# Patient Record
Sex: Female | Born: 2000 | Race: White | Hispanic: No | Marital: Single | Smoking: Current some day smoker
Health system: Southern US, Community
[De-identification: ages and names within clinical notes are randomized; demographics above are authoritative.]

## PROBLEM LIST (undated history)

## (undated) DIAGNOSIS — J45909 Unspecified asthma, uncomplicated: Secondary | ICD-10-CM

## (undated) DIAGNOSIS — F32A Depression, unspecified: Secondary | ICD-10-CM

## (undated) DIAGNOSIS — F909 Attention-deficit hyperactivity disorder, unspecified type: Secondary | ICD-10-CM

## (undated) DIAGNOSIS — F419 Anxiety disorder, unspecified: Secondary | ICD-10-CM

---
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
## 2020-03-11 ENCOUNTER — Other Ambulatory Visit: Payer: Self-pay

## 2020-03-11 ENCOUNTER — Encounter: Payer: Self-pay | Admitting: Emergency Medicine

## 2020-03-11 DIAGNOSIS — F17210 Nicotine dependence, cigarettes, uncomplicated: Secondary | ICD-10-CM | POA: Diagnosis not present

## 2020-03-11 DIAGNOSIS — F320 Major depressive disorder, single episode, mild: Secondary | ICD-10-CM | POA: Insufficient documentation

## 2020-03-11 DIAGNOSIS — J45909 Unspecified asthma, uncomplicated: Secondary | ICD-10-CM | POA: Insufficient documentation

## 2020-03-11 DIAGNOSIS — R45851 Suicidal ideations: Secondary | ICD-10-CM | POA: Insufficient documentation
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
## 2020-03-12 ENCOUNTER — Emergency Department
Admission: EM | Admit: 2020-03-12 | Discharge: 2020-03-12 | Disposition: A | Discharge status: Home / Self Care | Payer: Medicaid Other | Attending: Emergency Medicine | Admitting: Emergency Medicine

## 2020-03-12 DIAGNOSIS — R45851 Suicidal ideations: Secondary | ICD-10-CM

## 2020-03-12 HISTORY — DX: Anxiety disorder, unspecified: F41.9

## 2020-03-12 HISTORY — DX: Attention-deficit hyperactivity disorder, unspecified type: F90.9

## 2020-03-12 HISTORY — DX: Depression, unspecified: F32.A

## 2020-03-12 HISTORY — DX: Unspecified asthma, uncomplicated: J45.909

## 2020-03-12 LAB — CBC
HCT: 46.9 % — ABNORMAL HIGH (ref 36.0–46.0)
Hemoglobin: 15.9 g/dL — ABNORMAL HIGH (ref 12.0–15.0)
MCH: 30.5 pg (ref 26.0–34.0)
MCHC: 33.9 g/dL (ref 30.0–36.0)
MCV: 90 fL (ref 80.0–100.0)
Platelets: 329 10*3/uL (ref 150–400)
RBC: 5.21 MIL/uL — ABNORMAL HIGH (ref 3.87–5.11)
RDW: 13.2 % (ref 11.5–15.5)
WBC: 9.7 10*3/uL (ref 4.0–10.5)
nRBC: 0 % (ref 0.0–0.2)

## 2020-03-12 LAB — URINE DRUG SCREEN, QUALITATIVE (ARMC ONLY)
Amphetamines, Ur Screen: NOT DETECTED
Barbiturates, Ur Screen: NOT DETECTED
Benzodiazepine, Ur Scrn: NOT DETECTED
Cannabinoid 50 Ng, Ur ~~LOC~~: POSITIVE — AB
Cocaine Metabolite,Ur ~~LOC~~: NOT DETECTED
MDMA (Ecstasy)Ur Screen: NOT DETECTED
Methadone Scn, Ur: NOT DETECTED
Opiate, Ur Screen: NOT DETECTED
Phencyclidine (PCP) Ur S: NOT DETECTED
Tricyclic, Ur Screen: NOT DETECTED

## 2020-03-12 LAB — ETHANOL: Alcohol, Ethyl (B): <10 mg/dL (ref <10)

## 2020-03-12 LAB — COMPREHENSIVE METABOLIC PANEL
ALT: 15 U/L (ref 0–44)
AST: 23 U/L (ref 15–41)
Albumin: 5 g/dL (ref 3.5–5.0)
Alkaline Phosphatase: 79 U/L (ref 38–126)
Anion gap: 10 (ref 5–15)
BUN: 8 mg/dL (ref 6–20)
CO2: 24 mmol/L (ref 22–32)
Calcium: 9.3 mg/dL (ref 8.9–10.3)
Chloride: 105 mmol/L (ref 98–111)
Creatinine, Ser: 0.87 mg/dL (ref 0.44–1.00)
GFR calc Af Amer: >60 mL/min (ref >60)
GFR calc non Af Amer: >60 mL/min (ref >60)
Glucose, Bld: 82 mg/dL (ref 70–99)
Potassium: 3.2 mmol/L — ABNORMAL LOW (ref 3.5–5.1)
Sodium: 139 mmol/L (ref 135–145)
Total Bilirubin: 0.8 mg/dL (ref 0.3–1.2)
Total Protein: 9 g/dL — ABNORMAL HIGH (ref 6.5–8.1)

## 2020-03-12 LAB — POCT PREGNANCY, URINE: Preg Test, Ur: NEGATIVE

## 2020-03-12 LAB — ACETAMINOPHEN LEVEL: Acetaminophen (Tylenol), Serum: <10 ug/mL — ABNORMAL LOW (ref 10–30)

## 2020-03-12 LAB — SALICYLATE LEVEL: Salicylate Lvl: <7 mg/dL — ABNORMAL LOW (ref 7.0–30.0)

## 2020-03-12 NOTE — ED Provider Notes (Signed)
She was seen by the psychiatric team and will be discharged home.  No changes in medications.   Concha Se, MD 03/12/20 [phone]

## 2020-03-12 NOTE — ED Notes (Signed)
Report off to andrea rn  

## 2020-03-12 NOTE — ED Notes (Signed)
IVC   RESCINDED  PT  NOW  VOL

## 2020-03-12 NOTE — ED Notes (Signed)
Pt signed paper copy of d/c paperwork. Pt boyfriend on the way to pick her up.
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
## 2020-03-12 NOTE — Discharge Instructions (Addendum)
You are cleared by the psychiatric team for discharge home.  
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
## 2020-03-12 NOTE — ED Provider Notes (Signed)
[facility] Emergency Department Provider Note  ____________________________________________   First MD Initiated Contact with Patient 03/12/20 0141     (approximate)  I have reviewed the triage vital signs and the nursing notes.   HISTORY  Chief Complaint Suicidal    HPI Danielle Weaver is a 19 y.o. female with past medical history of ADHD anxiety depression and asthma presents to the emergency department secondary to suicidal ideation.  Patient states that she attempted to run onto traffic but was stopped by her boyfriend.  She states that she intended to kill herself by doing so.  Patient does admit to previous self injury via cutting.       Past Medical History:  Diagnosis Date  . ADHD   . Anxiety   . Asthma   . Depression     There are no problems to display for this patient.   History reviewed. No pertinent surgical history.  Prior to Admission medications   Not on File    Allergies Amoxicillin  No family history on file.  Social History Social History   Tobacco Use  . Smoking status: Current Some Day Smoker    Packs/day: 0.00    Types: Cigarettes  . Smokeless tobacco: Never Used  Vaping Use  . Vaping Use: Every day  Substance Use Topics  . Alcohol use: Not Currently  . Drug use: Not Currently    Review of Systems Constitutional: No fever/chills Eyes: No visual changes. ENT: No sore throat. Cardiovascular: Denies chest pain. Respiratory: Denies shortness of breath. Gastrointestinal: No abdominal pain.  No nausea, no vomiting.  No diarrhea.  No constipation. Genitourinary: Negative for dysuria. Musculoskeletal: Negative for neck pain.  Negative for back pain. Integumentary: Negative for rash. Neurological: Negative for headaches, focal weakness or numbness. Psychiatric:  Positive for suicidal ideation  ____________________________________________   PHYSICAL EXAM:  VITAL SIGNS: ED Triage Vitals  Enc Vitals  Group     BP 03/11/20 2335 (!) 122/91     Pulse Rate 03/11/20 2335 98     Resp 03/11/20 2335 18     Temp 03/11/20 2335 97.7 F (36.5 C)     Temp Source 03/11/20 2335 Oral     SpO2 03/11/20 2335 100 %     Weight 03/11/20 2340 52.2 kg (115 lb)     Height 03/11/20 2340 1.6 m (5\' 3" )     Head Circumference --      Peak Flow --      Pain Score 03/11/20 2340 0     Pain Loc --      Pain Edu? --      Excl. in [place]? --     Constitutional: Alert and oriented.  Eyes: Conjunctivae are normal.  Head: Atraumatic. Mouth/Throat: Patient is wearing a mask. Neck: No stridor.  No meningeal signs.   Cardiovascular: Normal rate, regular rhythm. Good peripheral circulation. Grossly normal heart sounds. Respiratory: Normal respiratory effort.  No retractions. Gastrointestinal: Soft and nontender. No distention.  Musculoskeletal: No lower extremity tenderness nor edema. No gross deformities of extremities. Neurologic:  Normal speech and language. No gross focal neurologic deficits are appreciated.  Skin:  Skin is warm, dry and intact. Psychiatric: Depressed mood.  Speech and behavior are normal.  ____________________________________________   LABS (all labs ordered are listed, but only abnormal results are displayed)  Labs Reviewed  COMPREHENSIVE METABOLIC PANEL - Abnormal; Notable for the following components:      Result Value   Potassium 3.2 (*)  Total Protein 9.0 (*)    All other components within normal limits  SALICYLATE LEVEL - Abnormal; Notable for the following components:   Salicylate Lvl <7.0 (*)    All other components within normal limits  ACETAMINOPHEN LEVEL - Abnormal; Notable for the following components:   Acetaminophen (Tylenol), Serum <10 (*)    All other components within normal limits  CBC - Abnormal; Notable for the following components:   RBC 5.21 (*)    Hemoglobin 15.9 (*)    HCT 46.9 (*)    All other components within normal limits  URINE DRUG SCREEN, QUALITATIVE  ([facility] ONLY) - Abnormal; Notable for the following components:   Cannabinoid 50 Ng, Ur [place] POSITIVE (*)    All other components within normal limits  ETHANOL  POCT PREGNANCY, URINE  POC URINE PREG, ED     Procedures   ____________________________________________   INITIAL IMPRESSION / MDM / ASSESSMENT AND PLAN / ED COURSE  As part of my medical decision making, I reviewed the following data within the electronic MEDICAL RECORD NUMBER   19 year old female presented with above-stated history and physical exam secondary to depressed mood with suicidal ideation.  Awaiting psychiatry evaluation and disposition.  The patient has been placed in psychiatric observation due to the need to provide a safe environment for the patient while obtaining psychiatric consultation and evaluation, as well as ongoing medical and medication management to treat the patient's condition.  The patient has not been placed under full IVC at this time.   ____________________________________________  FINAL CLINICAL IMPRESSION(S) / ED DIAGNOSES  Final diagnoses:  Suicidal ideation     MEDICATIONS GIVEN DURING THIS VISIT:  Medications - No data to display   ED Discharge Orders    None      *Please note:  Danielle Weaver was evaluated in Emergency Department on 03/12/2020 for the symptoms described in the history of present illness. She was evaluated in the context of the global COVID-19 pandemic, which necessitated consideration that the patient might be at risk for infection with the SARS-CoV-2 virus that causes COVID-19. Institutional protocols and algorithms that pertain to the evaluation of patients at risk for COVID-19 are in a state of rapid change based on information released by regulatory bodies including the CDC and federal and state organizations. These policies and algorithms were followed during the patient's care in the ED.  Some ED evaluations and interventions may be delayed as a result of limited  staffing during and after the pandemic.*  Note:  This document was prepared using Dragon voice recognition software and may include unintentional dictation errors.   Darci Current, MD 03/12/20 [phone]

## 2020-03-26 ENCOUNTER — Encounter: Payer: Self-pay | Admitting: Endocrinology

## 2020-03-26 LAB — TSH: TSH: 1.992

## 2020-03-26 LAB — T3: T3, Free: 98.74

## 2020-03-26 LAB — CREATINE: Creatine, Serum: 1.1

## 2020-03-26 LAB — BUN+CREAT: BUN/Creatinine Ratio: 10.9

## 2020-03-26 LAB — T4: T4,Free (Direct): 1.34

## 2020-12-19 ENCOUNTER — Ambulatory Visit: Payer: Medicaid Other | Admitting: Endocrinology
